# Patient Record
Sex: Male | Born: 1965 | Race: White | Hispanic: No | Marital: Single | Smoking: Current every day smoker
Health system: Southern US, Community
[De-identification: ages and names within clinical notes are randomized; demographics above are authoritative.]

---

## 2012-05-31 ENCOUNTER — Emergency Department: Payer: Self-pay | Admitting: Emergency Medicine

## 2013-03-25 ENCOUNTER — Emergency Department: Payer: Self-pay | Admitting: Internal Medicine

## 2015-07-01 ENCOUNTER — Emergency Department
Admission: EM | Admit: 2015-07-01 | Discharge: 2015-07-01 | Disposition: A | Discharge status: Home / Self Care | Payer: Self-pay | Attending: Emergency Medicine | Admitting: Emergency Medicine

## 2015-07-01 ENCOUNTER — Encounter: Payer: Self-pay | Admitting: Emergency Medicine

## 2015-07-01 DIAGNOSIS — S40812A Abrasion of left upper arm, initial encounter: Secondary | ICD-10-CM | POA: Insufficient documentation

## 2015-07-01 DIAGNOSIS — T07XXXA Unspecified multiple injuries, initial encounter: Secondary | ICD-10-CM

## 2015-07-01 DIAGNOSIS — F17210 Nicotine dependence, cigarettes, uncomplicated: Secondary | ICD-10-CM | POA: Insufficient documentation

## 2015-07-01 DIAGNOSIS — Y929 Unspecified place or not applicable: Secondary | ICD-10-CM | POA: Insufficient documentation

## 2015-07-01 DIAGNOSIS — Y9389 Activity, other specified: Secondary | ICD-10-CM | POA: Insufficient documentation

## 2015-07-01 DIAGNOSIS — Y999 Unspecified external cause status: Secondary | ICD-10-CM | POA: Insufficient documentation

## 2015-07-01 DIAGNOSIS — S7012XA Contusion of left thigh, initial encounter: Secondary | ICD-10-CM | POA: Insufficient documentation

## 2015-07-01 MED ORDER — CYCLOBENZAPRINE HCL 5 MG PO TABS: 5.0000 mg | ORAL_TABLET | Freq: Three times a day (TID) | ORAL | Status: DC | PRN

## 2015-07-01 MED ORDER — NAPROXEN 500 MG PO TABS
500.0000 mg | ORAL_TABLET | Freq: Once | ORAL | Status: AC
  Administered 2015-07-01: 500 mg via ORAL
  Filled 2015-07-01: qty 1

## 2015-07-01 MED ORDER — CYCLOBENZAPRINE HCL 10 MG PO TABS
5.0000 mg | ORAL_TABLET | Freq: Once | ORAL | Status: AC
  Administered 2015-07-01: 5 mg via ORAL
  Filled 2015-07-01: qty 1

## 2015-07-01 MED ORDER — NAPROXEN 500 MG PO TBEC: 500.0000 mg | DELAYED_RELEASE_TABLET | Freq: Two times a day (BID) | ORAL | Status: DC
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
## 2015-07-01 NOTE — Discharge Instructions (Signed)
General Assault Assault includes any behavior or physical attack--whether it is on purpose or not--that results in injury to another person, damage to property, or both. This also includes assault that has not yet happened, but is planned to happen. Threats of assault may be physical, verbal, or written. They may be said or sent by:  Mail.  E-mail.  Text.  Social media.  Fax. The threats may be direct, implied, or understood. WHAT ARE THE DIFFERENT FORMS OF ASSAULT? Forms of assault include:  Physically assaulting a person. This includes physical threats to inflict physical harm as well as:  Slapping.  Hitting.  Poking.  Kicking.  Punching.  Pushing.  Sexually assaulting a person. Sexual assault is any sexual activity that a person is forced, threatened, or coerced to participate in. It may or may not involve physical contact with the person who is assaulting you. You are sexually assaulted if you are forced to have sexual contact of any kind.  Damaging or destroying a person's assistive equipment, such as glasses, canes, or walkers.  Throwing or hitting objects.  Using or displaying a weapon to harm or threaten someone.  Using or displaying an object that appears to be a weapon in a threatening manner.  Using greater physical size or strength to intimidate someone.  Making intimidating or threatening gestures.  Bullying.  Hazing.  Using language that is intimidating, threatening, hostile, or abusive.  Stalking.  Restraining someone with force. WHAT SHOULD I DO IF I EXPERIENCE ASSAULT?  Report assaults, threats, and stalking to the police. Call your local emergency services (911 in the [place] if you are in immediate danger or you need medical help.  You can work with a Clinical research associatelawyer or an advocate to get legal protection against someone who has assaulted you or threatened you with assault. Protection includes restraining orders and private addresses. Crimes against  you, such as assault, can also be prosecuted through the courts. Laws will vary depending on where you live.   This information is not intended to replace advice given to you by your health care provider. Make sure you discuss any questions you have with your health care provider.   Document Released: 02/26/2005 Document Revised: 03/19/2014 Document Reviewed: 11/13/2013 Elsevier Interactive Patient Education 2016 [employer]  Your exam is essentially normal. You have multiple abrasions and contusions to your arms and legs. Apply ice to reduce swelling. Take the prescription meds as directed. Follow-up with [facility] as needed.

## 2015-10-30 ENCOUNTER — Emergency Department: Payer: Self-pay

## 2015-10-30 ENCOUNTER — Emergency Department
Admission: EM | Admit: 2015-10-30 | Discharge: 2015-10-30 | Disposition: A | Discharge status: Home / Self Care | Payer: Self-pay | Attending: Emergency Medicine | Admitting: Emergency Medicine

## 2015-10-30 ENCOUNTER — Encounter: Payer: Self-pay | Admitting: Emergency Medicine

## 2015-10-30 DIAGNOSIS — S300XXA Contusion of lower back and pelvis, initial encounter: Secondary | ICD-10-CM | POA: Insufficient documentation

## 2015-10-30 DIAGNOSIS — Y939 Activity, unspecified: Secondary | ICD-10-CM | POA: Insufficient documentation

## 2015-10-30 DIAGNOSIS — F17210 Nicotine dependence, cigarettes, uncomplicated: Secondary | ICD-10-CM | POA: Insufficient documentation

## 2015-10-30 DIAGNOSIS — Y999 Unspecified external cause status: Secondary | ICD-10-CM | POA: Insufficient documentation

## 2015-10-30 DIAGNOSIS — Y92410 Unspecified street and highway as the place of occurrence of the external cause: Secondary | ICD-10-CM | POA: Insufficient documentation

## 2015-10-30 MED ORDER — NAPROXEN 500 MG PO TABS: 500.0000 mg | ORAL_TABLET | Freq: Two times a day (BID) | ORAL | 0 refills | Status: DC

## 2015-10-30 MED ORDER — OXYCODONE-ACETAMINOPHEN 5-325 MG PO TABS
2.0000 | ORAL_TABLET | Freq: Once | ORAL | Status: AC
  Administered 2015-10-30: 2 via ORAL
  Filled 2015-10-30: qty 2

## 2015-10-30 MED ORDER — OXYCODONE-ACETAMINOPHEN 5-325 MG PO TABS: 1.0000 | ORAL_TABLET | ORAL | 0 refills | Status: AC | PRN

## 2015-10-30 MED ORDER — METHOCARBAMOL 750 MG PO TABS: 750.0000 mg | ORAL_TABLET | Freq: Four times a day (QID) | ORAL | 0 refills | Status: AC

## 2015-10-30 NOTE — Discharge Instructions (Signed)
Take all medications as prescribed. Follow orthopedics or one of the other clinics for establishment of care. Return to the ER with any worsening or new developing of symptomology.
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
## 2021-07-15 ENCOUNTER — Other Ambulatory Visit: Payer: Self-pay

## 2021-07-15 ENCOUNTER — Emergency Department
Admission: EM | Admit: 2021-07-15 | Discharge: 2021-07-15 | Disposition: A | Discharge status: Home / Self Care | Payer: Self-pay | Attending: Emergency Medicine | Admitting: Emergency Medicine

## 2021-07-15 ENCOUNTER — Encounter: Payer: Self-pay | Admitting: Emergency Medicine

## 2021-07-15 ENCOUNTER — Emergency Department: Payer: Self-pay

## 2021-07-15 DIAGNOSIS — M79604 Pain in right leg: Secondary | ICD-10-CM | POA: Insufficient documentation

## 2021-07-15 DIAGNOSIS — M25561 Pain in right knee: Secondary | ICD-10-CM | POA: Insufficient documentation

## 2021-07-15 DIAGNOSIS — S76211A Strain of adductor muscle, fascia and tendon of right thigh, initial encounter: Secondary | ICD-10-CM

## 2021-07-15 DIAGNOSIS — S39011A Strain of muscle, fascia and tendon of abdomen, initial encounter: Secondary | ICD-10-CM | POA: Insufficient documentation

## 2021-07-15 DIAGNOSIS — S7001XA Contusion of right hip, initial encounter: Secondary | ICD-10-CM | POA: Insufficient documentation

## 2021-07-15 DIAGNOSIS — M25551 Pain in right hip: Secondary | ICD-10-CM | POA: Insufficient documentation

## 2021-07-15 MED ORDER — NAPROXEN 500 MG PO TABS: 500.0000 mg | ORAL_TABLET | Freq: Two times a day (BID) | ORAL | 2 refills | Status: AC

## 2021-07-15 MED ORDER — KETOROLAC TROMETHAMINE 30 MG/ML IJ SOLN
30.0000 mg | Freq: Once | INTRAMUSCULAR | Status: AC
  Administered 2021-07-15: 30 mg via INTRAMUSCULAR
  Filled 2021-07-15: qty 1
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
## 2021-07-15 NOTE — ED Notes (Signed)
PT provides verbal consent for DC at this time. Pt denies any questions. Pt declines vs at time of dc Pt rx information given. Pt ambulatory to lobby on crutches alert and oriented x4.  ?
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
## 2024-03-12 ENCOUNTER — Encounter: Payer: Self-pay | Admitting: Emergency Medicine

## 2024-03-12 ENCOUNTER — Other Ambulatory Visit: Payer: Self-pay

## 2024-03-12 ENCOUNTER — Emergency Department
Admission: EM | Admit: 2024-03-12 | Discharge: 2024-03-12 | Disposition: A | Discharge status: Home / Self Care | Payer: Self-pay | Attending: Emergency Medicine | Admitting: Emergency Medicine

## 2024-03-12 ENCOUNTER — Emergency Department: Payer: Self-pay

## 2024-03-12 DIAGNOSIS — R072 Precordial pain: Secondary | ICD-10-CM | POA: Insufficient documentation

## 2024-03-12 DIAGNOSIS — B9789 Other viral agents as the cause of diseases classified elsewhere: Secondary | ICD-10-CM | POA: Insufficient documentation

## 2024-03-12 DIAGNOSIS — J069 Acute upper respiratory infection, unspecified: Secondary | ICD-10-CM | POA: Insufficient documentation

## 2024-03-12 DIAGNOSIS — F17210 Nicotine dependence, cigarettes, uncomplicated: Secondary | ICD-10-CM | POA: Insufficient documentation

## 2024-03-12 DIAGNOSIS — F1490 Cocaine use, unspecified, uncomplicated: Secondary | ICD-10-CM | POA: Insufficient documentation

## 2024-03-12 DIAGNOSIS — Z716 Tobacco abuse counseling: Secondary | ICD-10-CM | POA: Insufficient documentation

## 2024-03-12 DIAGNOSIS — R079 Chest pain, unspecified: Secondary | ICD-10-CM

## 2024-03-12 DIAGNOSIS — R6889 Other general symptoms and signs: Secondary | ICD-10-CM

## 2024-03-12 LAB — CBC WITH DIFFERENTIAL/PLATELET
Abs Immature Granulocytes: 0.03 K/uL (ref 0.00–0.07)
Basophils Absolute: 0 K/uL (ref 0.0–0.1)
Basophils Relative: 0 %
Eosinophils Absolute: 0 K/uL (ref 0.0–0.5)
Eosinophils Relative: 0 %
HCT: 47.1 % (ref 39.0–52.0)
Hemoglobin: 15.5 g/dL (ref 13.0–17.0)
Immature Granulocytes: 0 %
Lymphocytes Relative: 7 %
Lymphs Abs: 0.6 K/uL — ABNORMAL LOW (ref 0.7–4.0)
MCH: 29.8 pg (ref 26.0–34.0)
MCHC: 32.9 g/dL (ref 30.0–36.0)
MCV: 90.4 fL (ref 80.0–100.0)
Monocytes Absolute: 0.5 K/uL (ref 0.1–1.0)
Monocytes Relative: 5 %
Neutro Abs: 7.8 K/uL — ABNORMAL HIGH (ref 1.7–7.7)
Neutrophils Relative %: 88 %
Platelets: 178 K/uL (ref 150–400)
RBC: 5.21 MIL/uL (ref 4.22–5.81)
RDW: 12.7 % (ref 11.5–15.5)
WBC: 8.9 K/uL (ref 4.0–10.5)
nRBC: 0 % (ref 0.0–0.2)

## 2024-03-12 LAB — COMPREHENSIVE METABOLIC PANEL WITH GFR
ALT: 11 U/L (ref 0–44)
AST: 27 U/L (ref 15–41)
Albumin: 3.5 g/dL (ref 3.5–5.0)
Alkaline Phosphatase: 58 U/L (ref 38–126)
Anion gap: 14 (ref 5–15)
BUN: 12 mg/dL (ref 6–20)
CO2: 22 mmol/L (ref 22–32)
Calcium: 8 mg/dL — ABNORMAL LOW (ref 8.9–10.3)
Chloride: 98 mmol/L (ref 98–111)
Creatinine, Ser: 1.29 mg/dL — ABNORMAL HIGH (ref 0.61–1.24)
GFR, Estimated: >60 mL/min
Glucose, Bld: 106 mg/dL — ABNORMAL HIGH (ref 70–99)
Potassium: 3.7 mmol/L (ref 3.5–5.1)
Sodium: 134 mmol/L — ABNORMAL LOW (ref 135–145)
Total Bilirubin: 0.4 mg/dL (ref 0.0–1.2)
Total Protein: 5.6 g/dL — ABNORMAL LOW (ref 6.5–8.1)

## 2024-03-12 LAB — TROPONIN T, HIGH SENSITIVITY
Troponin T High Sensitivity: <15 ng/L (ref 0–19)
Troponin T High Sensitivity: <15 ng/L (ref 0–19)

## 2024-03-12 MED ORDER — ONDANSETRON 4 MG PO TBDP: 4.0000 mg | ORAL_TABLET | Freq: Three times a day (TID) | ORAL | 0 refills | Status: DC | PRN

## 2024-03-12 MED ORDER — NICOTINE 7 MG/24HR TD PT24: 7.0000 mg | MEDICATED_PATCH | Freq: Every day | TRANSDERMAL | 0 refills | Status: AC

## 2024-03-12 MED ORDER — OSELTAMIVIR PHOSPHATE 75 MG PO CAPS: 75.0000 mg | ORAL_CAPSULE | Freq: Two times a day (BID) | ORAL | 0 refills | Status: AC

## 2024-03-12 MED ORDER — OSELTAMIVIR PHOSPHATE 75 MG PO CAPS: 75.0000 mg | ORAL_CAPSULE | Freq: Two times a day (BID) | ORAL | 0 refills | Status: DC

## 2024-03-12 MED ORDER — ONDANSETRON 4 MG PO TBDP: 4.0000 mg | ORAL_TABLET | Freq: Three times a day (TID) | ORAL | 0 refills | Status: AC | PRN

## 2024-03-12 MED ORDER — NICOTINE POLACRILEX 4 MG MT LOZG: 4.0000 mg | LOZENGE | OROMUCOSAL | 0 refills | Status: AC | PRN

## 2024-03-12 NOTE — Discharge Instructions (Addendum)
 It is highly likely that you have influenza. Take acetaminophen  650 mg and ibuprofen 400 mg every 6 hours for aches/pain/fever.  Take with food. Take Tamiflu for 5 days as prescribed. Zofran is a medication that can help with nausea.  Take as directed.  I have given you prescription for nicotine replacement therapy to help you quit smoking.  Take as prescribed.  For all these prescriptions, if you are paying cash, ask the pharmacist to apply a GoodRx coupon to help reduce price.  See attached resources for drug treatment programs in the community to help you with the marijuana and cocaine use.

## 2024-03-12 NOTE — ED Provider Notes (Signed)
----------------------------------------- °  7:33 AM on 03/12/2024 ----------------------------------------- Patient care assumed from Dr. Cyrena.  Patient's lab work shows a reassuring CBC reassuring chemistry.  Troponin has resulted negative x 2.  Given the patient's reassuring workup I believe the patient is safe for discharge home treating for flulike illness.   Dorothyann Drivers, MD 03/12/24 [phone]

## 2024-03-12 NOTE — ED Provider Notes (Signed)
 "  [facility] Provider Note    Event Date/Time   First MD Initiated Contact with Patient 03/12/24 [phone]     (approximate)   History   Chest Pain   HPI  Daniel Knight is a 59 y.o. male   Past medical history of smoker of cigarettes and marijuana, occasional crack cocaine use, here with approximately 3 to 4 days of productive cough, nasal congestion, headache, myalgias, and subjective fever/chills.  Tonight after smoking crack cocaine he also developed substernal chest pain.  He denies any GI or GU symptoms aside from nausea but no vomiting.  Bowel movements normal.  No urinary symptoms.  Smokes marijuana often and smokes about a pack of cigarettes a day.  Independent Historian contributed to assessment above: Wife corroborates information past medical history as above  External Medical Documents Reviewed: Previous hospital notes      Physical Exam   Triage Vital Signs: ED Triage Vitals  Encounter Vitals Group     BP 03/12/24 0230 102/74     Girls Systolic BP Percentile --      Girls Diastolic BP Percentile --      Boys Systolic BP Percentile --      Boys Diastolic BP Percentile --      Pulse Rate 03/12/24 0230 93     Resp 03/12/24 0230 (!) 28     Temp 03/12/24 0230 99.1 F (37.3 C)     Temp Source 03/12/24 0230 Oral     SpO2 03/12/24 0219 98 %     Weight 03/12/24 0227 180 lb (81.6 kg)     Height 03/12/24 0227 5' 8 (1.727 m)     Head Circumference --      Peak Flow --      Pain Score 03/12/24 0225 7     Pain Loc --      Pain Education --      Exclude from Growth Chart --     Most recent vital signs: Vitals:   03/12/24 0230 03/12/24 0546  BP: 102/74 (!) 106/57  Pulse: 93 74  Resp: (!) 28 20  Temp: 99.1 F (37.3 C) 98.9 F (37.2 C)  SpO2: 100% 95%    General: Awake, no distress.  CV:  Good peripheral perfusion.  Resp:  Normal effort.  Abd:  No distention.  Other:  Resting comfortably, pleasant gentleman no acute  distress.  Soft nontender abdomen to the palpation all quadrants.  Clear lungs without focality or wheezing.  No obvious heart murmur.  Skin appears warm and well-perfused.  Neck supple full range of motion   ED Results / Procedures / Treatments   Labs (all labs ordered are listed, but only abnormal results are displayed) Labs Reviewed  CBC WITH DIFFERENTIAL/PLATELET - Abnormal; Notable for the following components:      Result Value   Neutro Abs 7.8 (*)    Lymphs Abs 0.6 (*)    All other components within normal limits  COMPREHENSIVE METABOLIC PANEL WITH GFR - Abnormal; Notable for the following components:   Sodium 134 (*)    Glucose, Bld 106 (*)    Creatinine, Ser 1.29 (*)    Calcium 8.0 (*)    Total Protein 5.6 (*)    All other components within normal limits  TROPONIN T, HIGH SENSITIVITY  TROPONIN T, HIGH SENSITIVITY     I ordered and reviewed the above labs they are notable for cell counts electrolytes and initial troponin unremarkable.  EKG  ED  ECG REPORT I, Ginnie Shams, the attending physician, personally viewed and interpreted this ECG.   Date: 03/12/2024  EKG Time: 0228  Rate: 91  Rhythm: sinus  Axis: nl  Intervals:nl  ST&T Change: no stemi    RADIOLOGY I independently reviewed and interpreted chest x-ray and I see no obvious focality pneumothorax I also reviewed radiologist's formal read.   PROCEDURES:  Critical Care performed: No  Procedures   MEDICATIONS ORDERED IN ED: Medications - No data to display    IMPRESSION / MDM / ASSESSMENT AND PLAN / ED COURSE  I reviewed the triage vital signs and the nursing notes.                                Patient's presentation is most consistent with acute presentation with potential threat to life or bodily function.  Differential diagnosis includes, but is not limited to, viral URI, influenza, bacterial pneumonia, considered but less likely sepsis or meningitis, considered ACS   The patient is on  the cardiac monitor to evaluate for evidence of arrhythmia and/or significant heart rate changes.  MDM:    Patient with viral illness for the last 3 days in the setting of high community rates of influenza I highly suspect influenza.  Given guidance from the emergency department leadership,  will defer influenza testing with viral swabs and treat presumptively.  Given his comorbidities, despite being just outside of the typical 48-hour window for Tamiflu, I will prescribe nonetheless.  Also gave anticipatory guidance about viral illness and general, and advised to follow-up with PMD.  I also considered ACS given his crack cocaine use and substernal chest pain starting last night.  Fortunately EKG nonischemic and initial troponin negative.  Given the acuity of onset and some cardiac risk factors I think it is important to follow-up with another troponin before ruling out ACS.    I considered hospitalization for admission or observation however given his overall well appearance, no hypoxemia or respiratory distress and ability to perform activities of daily living despite his illness, I think outpatient follow-up and outpatient treatment is most appropriate at this time, assuming second troponin is unremarkable.   -- I spent 5 minutes counseling this patient on smoking cessation.  We spoke about the patient's current tobacco use, impact of smoking, assessed willingness to quit, methods for cessation including medical management and nicotine replacement therapy (which I prescribed to the patient) and advised follow-up with primary doctor to continue to address smoking cessation.        FINAL CLINICAL IMPRESSION(S) / ED DIAGNOSES   Final diagnoses:  Nonspecific chest pain  Flu-like symptoms  Viral URI with cough  Crack cocaine use  Encounter for smoking cessation counseling     Rx / DC Orders   ED Discharge Orders          Ordered    oseltamivir (TAMIFLU) 75 MG capsule  2 times  daily        03/12/24 0656    ondansetron (ZOFRAN-ODT) 4 MG disintegrating tablet  Every 8 hours PRN        03/12/24 0656    nicotine (NICODERM CQ - DOSED IN MG/24 HR) 7 mg/24hr patch  Daily        03/12/24 0657    nicotine polacrilex (NICOTINE MINI) 4 MG lozenge  As needed        03/12/24 9342  Note:  This document was prepared using Dragon voice recognition software and may include unintentional dictation errors.    Cyrena Mylar, MD 03/12/24 [phone]  "

## 2024-03-12 NOTE — ED Triage Notes (Addendum)
 Pt arrives via EMS from home; pt to triage via w/c, continuously spitting saliva in an emesis bag, anxious; st since Sunday having N/V/D, abd pain, CP, chills, prod cough yellow-green sputum, congestion; pt admits to cocaine use tonight
